# Patient Record
Sex: Female | Born: 1955 | Race: White | Hispanic: No | Marital: Married | Smoking: Never smoker
Health system: Southern US, Community
[De-identification: ages and names within clinical notes are randomized; demographics above are authoritative.]

## PROBLEM LIST (undated history)

## (undated) DIAGNOSIS — M1990 Unspecified osteoarthritis, unspecified site: Secondary | ICD-10-CM

## (undated) DIAGNOSIS — Z973 Presence of spectacles and contact lenses: Secondary | ICD-10-CM

## (undated) DIAGNOSIS — E039 Hypothyroidism, unspecified: Secondary | ICD-10-CM

## (undated) DIAGNOSIS — Z9889 Other specified postprocedural states: Secondary | ICD-10-CM

## (undated) DIAGNOSIS — K5903 Drug induced constipation: Secondary | ICD-10-CM

## (undated) DIAGNOSIS — E079 Disorder of thyroid, unspecified: Secondary | ICD-10-CM

## (undated) DIAGNOSIS — K219 Gastro-esophageal reflux disease without esophagitis: Secondary | ICD-10-CM

## (undated) DIAGNOSIS — T402X5A Adverse effect of other opioids, initial encounter: Secondary | ICD-10-CM

## (undated) DIAGNOSIS — I639 Cerebral infarction, unspecified: Secondary | ICD-10-CM

## (undated) DIAGNOSIS — R112 Nausea with vomiting, unspecified: Secondary | ICD-10-CM

## (undated) DIAGNOSIS — E7800 Pure hypercholesterolemia, unspecified: Secondary | ICD-10-CM

## (undated) HISTORY — PX: BILATERAL SALPINGOOPHORECTOMY: SHX1223

## (undated) HISTORY — PX: APPENDECTOMY: SHX54

## (undated) HISTORY — PX: DILATION AND CURETTAGE OF UTERUS: SHX78

## (undated) HISTORY — PX: HERNIA REPAIR: SHX51

## (undated) HISTORY — PX: TONSILLECTOMY: SUR1361

## (undated) HISTORY — PX: ABDOMINAL HYSTERECTOMY: SHX81

## (undated) HISTORY — PX: COLONOSCOPY: SHX174

---

## 2009-11-23 DIAGNOSIS — I639 Cerebral infarction, unspecified: Secondary | ICD-10-CM

## 2009-11-23 HISTORY — DX: Cerebral infarction, unspecified: I63.9

## 2010-11-17 ENCOUNTER — Emergency Department (HOSPITAL_BASED_OUTPATIENT_CLINIC_OR_DEPARTMENT_OTHER)
Admission: EM | Admit: 2010-11-17 | Discharge: 2010-11-17 | Disposition: A | Discharge status: Other Acute Inpatient Hospital | Payer: Self-pay | Source: Home / Self Care | Admitting: Emergency Medicine

## 2011-02-02 LAB — BASIC METABOLIC PANEL
CO2: 27 mEq/L (ref 19–32)
Creatinine, Ser: 0.6 mg/dL (ref 0.4–1.2)
GFR calc Af Amer: >60 mL/min (ref >60)
Glucose, Bld: 125 mg/dL — ABNORMAL HIGH (ref 70–99)
Potassium: 3.7 mEq/L (ref 3.5–5.1)
Sodium: 139 mEq/L (ref 135–145)

## 2011-02-02 LAB — DIFFERENTIAL
Basophils Absolute: 0 10*3/uL (ref 0.0–0.1)
Basophils Relative: 0 % (ref 0–1)
Eosinophils Absolute: 0 10*3/uL (ref 0.0–0.7)
Eosinophils Relative: 0 % (ref 0–5)

## 2011-02-02 LAB — CBC
HCT: 37.9 % (ref 36.0–46.0)
MCH: 30.1 pg (ref 26.0–34.0)
Platelets: 90 10*3/uL — ABNORMAL LOW (ref 150–400)
RDW: 12.5 % (ref 11.5–15.5)

## 2011-02-02 LAB — URINALYSIS, ROUTINE W REFLEX MICROSCOPIC
Ketones, ur: NEGATIVE mg/dL
Nitrite: NEGATIVE
Specific Gravity, Urine: 1.023 (ref 1.005–1.030)

## 2014-10-08 ENCOUNTER — Emergency Department (HOSPITAL_BASED_OUTPATIENT_CLINIC_OR_DEPARTMENT_OTHER): Payer: BC Managed Care – PPO

## 2014-10-08 ENCOUNTER — Emergency Department (HOSPITAL_BASED_OUTPATIENT_CLINIC_OR_DEPARTMENT_OTHER)
Admission: EM | Admit: 2014-10-08 | Discharge: 2014-10-08 | Disposition: A | Discharge status: Home / Self Care | Payer: BC Managed Care – PPO | Attending: Emergency Medicine | Admitting: Emergency Medicine

## 2014-10-08 ENCOUNTER — Encounter (HOSPITAL_BASED_OUTPATIENT_CLINIC_OR_DEPARTMENT_OTHER): Payer: Self-pay

## 2014-10-08 DIAGNOSIS — Y9289 Other specified places as the place of occurrence of the external cause: Secondary | ICD-10-CM | POA: Insufficient documentation

## 2014-10-08 DIAGNOSIS — Y998 Other external cause status: Secondary | ICD-10-CM | POA: Diagnosis not present

## 2014-10-08 DIAGNOSIS — S4991XA Unspecified injury of right shoulder and upper arm, initial encounter: Secondary | ICD-10-CM | POA: Diagnosis present

## 2014-10-08 DIAGNOSIS — Z880 Allergy status to penicillin: Secondary | ICD-10-CM | POA: Insufficient documentation

## 2014-10-08 DIAGNOSIS — E079 Disorder of thyroid, unspecified: Secondary | ICD-10-CM | POA: Insufficient documentation

## 2014-10-08 DIAGNOSIS — S42214A Unspecified nondisplaced fracture of surgical neck of right humerus, initial encounter for closed fracture: Secondary | ICD-10-CM | POA: Diagnosis not present

## 2014-10-08 DIAGNOSIS — Z79899 Other long term (current) drug therapy: Secondary | ICD-10-CM | POA: Insufficient documentation

## 2014-10-08 DIAGNOSIS — S42211A Unspecified displaced fracture of surgical neck of right humerus, initial encounter for closed fracture: Secondary | ICD-10-CM

## 2014-10-08 DIAGNOSIS — W010XXA Fall on same level from slipping, tripping and stumbling without subsequent striking against object, initial encounter: Secondary | ICD-10-CM | POA: Insufficient documentation

## 2014-10-08 DIAGNOSIS — Y9389 Activity, other specified: Secondary | ICD-10-CM | POA: Insufficient documentation

## 2014-10-08 DIAGNOSIS — E780 Pure hypercholesterolemia: Secondary | ICD-10-CM | POA: Insufficient documentation

## 2014-10-08 DIAGNOSIS — Z8673 Personal history of transient ischemic attack (TIA), and cerebral infarction without residual deficits: Secondary | ICD-10-CM | POA: Diagnosis not present

## 2014-10-08 DIAGNOSIS — T1490XA Injury, unspecified, initial encounter: Secondary | ICD-10-CM

## 2014-10-08 HISTORY — DX: Cerebral infarction, unspecified: I63.9

## 2014-10-08 HISTORY — DX: Disorder of thyroid, unspecified: E07.9

## 2014-10-08 HISTORY — DX: Pure hypercholesterolemia, unspecified: E78.00

## 2014-10-08 MED ORDER — HYDROCODONE-ACETAMINOPHEN 5-325 MG PO TABS: 1.0000 | ORAL_TABLET | Freq: Four times a day (QID) | ORAL | Status: DC | PRN

## 2014-10-08 MED ORDER — HYDROMORPHONE HCL 1 MG/ML IJ SOLN
1.0000 mg | Freq: Once | INTRAMUSCULAR | Status: AC
  Administered 2014-10-08: 1 mg via INTRAMUSCULAR
  Filled 2014-10-08: qty 1

## 2014-10-08 MED ORDER — ONDANSETRON 4 MG PO TBDP
4.0000 mg | ORAL_TABLET | Freq: Once | ORAL | Status: AC
  Administered 2014-10-08: 4 mg via ORAL
  Filled 2014-10-08: qty 1

## 2014-10-08 MED ORDER — HYDROCODONE-ACETAMINOPHEN 5-325 MG PO TABS
ORAL_TABLET | ORAL | Status: AC
  Administered 2014-10-08: 1 via ORAL
  Filled 2014-10-08: qty 1

## 2014-10-08 MED ORDER — HYDROCODONE-ACETAMINOPHEN 5-325 MG PO TABS
1.0000 | ORAL_TABLET | Freq: Once | ORAL | Status: AC
  Administered 2014-10-08: 1 via ORAL

## 2014-10-08 NOTE — ED Notes (Signed)
Trip/fall at home-right shoulder pain

## 2014-10-08 NOTE — ED Notes (Signed)
0.5mg  dilaudid was given at 21:46 and 0.5 was given now as pt states that she continues to have pain

## 2014-10-08 NOTE — ED Provider Notes (Addendum)
CSN: [id number]     Arrival date & time 10/08/14  2038 History  This chart was scribed for Kim CookeyMegan Docherty, MD by Charline BillsEssence Howell, ED Scribe. The patient was seen in room MH06/MH06. Patient's care was started at 10:34 PM.   Chief Complaint  Patient presents with  . Fall   Patient is a 58 y.o. female presenting with fall. The history is provided by the patient. No language interpreter was used.  Fall This is a new problem. The current episode started 1 to 2 hours ago. The problem has been gradually worsening. Pertinent negatives include no chest pain, no abdominal pain, no headaches and no shortness of breath. The symptoms are aggravated by bending and exertion. Nothing relieves the symptoms.    HPI Comments: [name] is a 58 y.o. female, thyroid disease, high cholesterol, stroke, who presents to the Emergency Department complaining of fall that occurred around 8:30 PM. Pt states that she tripped and fell on her R shoulder when she tried to avoid falling on a cat. She reports associated R shoulder pain. Pt denies LOC, hitting her head, abdominal pain, any other pain. She also denies feeling ill prior to fall.  Past Medical History  Diagnosis Date  . Thyroid disease   . High cholesterol   . Stroke    Past Surgical History  Procedure Laterality Date  . Appendectomy    . Tonsillectomy    . Abdominal hysterectomy     No family history on file. History  Substance Use Topics  . Smoking status: Never Smoker   . Smokeless tobacco: Not on file  . Alcohol Use: No   OB History    No data available     Review of Systems  Constitutional: Negative for fever, chills, diaphoresis, activity change, appetite change and fatigue.  HENT: Negative for congestion, facial swelling, rhinorrhea and sore throat.   Eyes: Negative for photophobia and discharge.  Respiratory: Negative for cough, chest tightness and shortness of breath.   Cardiovascular: Negative for chest pain, palpitations and leg  swelling.  Gastrointestinal: Negative for nausea, vomiting, abdominal pain and diarrhea.  Endocrine: Negative for polydipsia and polyuria.  Genitourinary: Negative for dysuria, frequency, difficulty urinating and pelvic pain.  Musculoskeletal: Positive for joint swelling and arthralgias. Negative for back pain, neck pain and neck stiffness.  Skin: Negative for color change and wound.  Allergic/Immunologic: Negative for immunocompromised state.  Neurological: Negative for facial asymmetry, weakness, numbness and headaches.  Hematological: Does not bruise/bleed easily.  Psychiatric/Behavioral: Negative for confusion and agitation.   Allergies  Bactrim and Penicillins  Home Medications   Prior to Admission medications   Medication Sig Start Date End Date Taking? Authorizing Provider  Levothyroxine Sodium (SYNTHROID PO) Take by mouth.   Yes Historical Provider, MD  LORAZEPAM PO Take by mouth.   Yes Historical Provider, MD  Rosuvastatin Calcium (CRESTOR PO) Take by mouth.   Yes Historical Provider, MD  HYDROcodone-acetaminophen (NORCO) 5-325 MG per tablet Take 1-2 tablets by mouth every 6 (six) hours as needed. 10/08/14   Kim CookeyMegan Docherty, MD   Triage Vitals: BP 172/90 mmHg  Pulse 90  Temp(Src) 98.2 F (36.8 C) (Oral)  Resp 18  Ht 5\' 5"  (1.651 m)  Wt 83 lb (37.649 kg)  BMI 13.81 kg/m2  SpO2 97% Physical Exam  Constitutional: She is oriented to person, place, and time. She appears well-developed and well-nourished. No distress.  HENT:  Head: Normocephalic.  Mouth/Throat: Oropharynx is clear and moist.  Eyes: Pupils are equal, round,  and reactive to light.  Neck: Neck supple.  Cardiovascular: Normal rate, regular rhythm and normal heart sounds.   Pulmonary/Chest: Effort normal and breath sounds normal. No respiratory distress. She has no wheezes.  Abdominal: Soft. She exhibits no distension. There is no tenderness. There is no rebound and no guarding.  Musculoskeletal: She exhibits  no edema or tenderness.       Right shoulder: She exhibits bony tenderness and swelling. She exhibits no crepitus.  Neurological: She is alert and oriented to person, place, and time. No cranial nerve deficit or sensory deficit. She exhibits normal muscle tone (except for ROM of R shoulder due to pain). Coordination normal. GCS eye subscore is 4. GCS verbal subscore is 5. GCS motor subscore is 6.  Skin: Skin is warm and dry.  Psychiatric: She has a normal mood and affect.  Nursing note and vitals reviewed.  ED Course  Procedures (including critical care time) DIAGNOSTIC STUDIES: Oxygen Saturation is 97% on RA, normal by my interpretation.    COORDINATION OF CARE: 10:52 PM-Discussed treatment plan which includes XR, Dilaudid, Norco and sling with pt at bedside and pt agreed to plan.   Labs Review Labs Reviewed - No data to display  Imaging Review Dg Shoulder Right  10/08/2014   CLINICAL DATA:  Right shoulder pain after falling today. Initial encounter.  EXAM: RIGHT SHOULDER - 2+ VIEW  COMPARISON:  None.  FINDINGS: Patient was unable to position for the axillary view. AP and Y-views are submitted. These demonstrate a mildly displaced fracture of the humeral neck which is best seen on the AP view. There is no dislocation or narrowing of the subacromial space. Probable subpleural scarring is noted at the right lung apex.  IMPRESSION: Mildly displaced fracture of the right humeral neck, presumably acute.   Electronically Signed   By: Roxy HorsemanBill  Veazey M.D.   On: 10/08/2014 21:23    EKG Interpretation None      MDM   Final diagnoses:  Humeral surgical neck fracture, right, closed, initial encounter    Pt is a 58 y.o. female with Pmhx as above who presents with right shoulder pain after mechanical fall as she tripped on a cat this evening. She did not hit her head. No loss of consciousness. She has otherwise been well. On physical exam she has edema and bony tenderness to the proximal humerus  and x-ray with nondisplaced humeral neck fracture. She is neurovascularly intact distally. She has no other signs of trauma. She has no pressured weakness or signs of acute stroke. Patient placed in a shoulder immobilizer for comfort, she'll be given Norco for pain and outpatient follow-up with Dr. [facility].    I personally performed the services described in this documentation, which was scribed in my presence. The recorded information has been reviewed and is accurate.    Kim CookeyMegan Docherty, MD 10/08/14 [id number]  Kim CookeyMegan Docherty, MD 10/08/14 [phone]

## 2014-10-08 NOTE — Discharge Instructions (Signed)
Humerus Fracture, Treated with Immobilization  The humerus is the large bone in your upper arm. You have a broken (fractured) humerus. These fractures are easily diagnosed with X-rays.  TREATMENT   Simple fractures which will heal without disability are treated with simple immobilization. Immobilization means you will wear a cast, splint, or sling. You have a fracture which will do well with immobilization. The fracture will heal well simply by being held in a good position until it is stable enough to begin range of motion exercises. Do not take part in activities which would further injure your arm.   HOME CARE INSTRUCTIONS    Put ice on the injured area.   Put ice in a plastic bag.   Place a towel between your skin and the bag.   Leave the ice on for 15-20 minutes, 03-04 times a day.   If you have a cast:   Do not scratch the skin under the cast using sharp or pointed objects.   Check the skin around the cast every day. You may put lotion on any red or sore areas.   Keep your cast dry and clean.   If you have a splint:   Wear the splint as directed.   Keep your splint dry and clean.   You may loosen the elastic around the splint if your fingers become numb, tingle, or turn cold or blue.   If you have a sling:   Wear the sling as directed.   Do not put pressure on any part of your cast or splint until it is fully hardened.   Your cast or splint can be protected during bathing with a plastic bag. Do not lower the cast or splint into water.   Only take over-the-counter or prescription medicines for pain, discomfort, or fever as directed by your caregiver.   Do range of motion exercises as instructed by your caregiver.   Follow up as directed by your caregiver. This is very important in order to avoid permanent injury or disability and chronic pain.  SEEK IMMEDIATE MEDICAL CARE IF:    Your skin or nails in the injured arm turn blue or gray.   Your arm feels cold or numb.   You develop severe  pain in the injured arm.   You are having problems with the medicines you were given.  MAKE SURE YOU:    Understand these instructions.   Will watch your condition.   Will get help right away if you are not doing well or get worse.  Document Released: 02/15/2001 Document Revised: 02/01/2012 Document Reviewed: 12/24/2010  Exit[employer] Patient Information 2015 [employer]. This information is not intended to replace advice given to you by your health care provider. Make sure you discuss any questions you have with your health care provider.

## 2014-10-15 ENCOUNTER — Ambulatory Visit: Payer: BC Managed Care – PPO | Admitting: Family Medicine

## 2014-10-15 ENCOUNTER — Ambulatory Visit (HOSPITAL_BASED_OUTPATIENT_CLINIC_OR_DEPARTMENT_OTHER)
Admission: RE | Admit: 2014-10-15 | Discharge: 2014-10-15 | Disposition: A | Discharge status: Home / Self Care | Payer: BC Managed Care – PPO | Source: Ambulatory Visit | Attending: Family Medicine | Admitting: Family Medicine

## 2014-10-15 ENCOUNTER — Encounter: Payer: Self-pay | Admitting: Family Medicine

## 2014-10-15 ENCOUNTER — Other Ambulatory Visit: Payer: Self-pay | Admitting: Family Medicine

## 2014-10-15 ENCOUNTER — Ambulatory Visit (INDEPENDENT_AMBULATORY_CARE_PROVIDER_SITE_OTHER): Payer: BC Managed Care – PPO | Admitting: Family Medicine

## 2014-10-15 VITALS — Ht 65.0 in | Wt 83.0 lb

## 2014-10-15 DIAGNOSIS — S42291P Other displaced fracture of upper end of right humerus, subsequent encounter for fracture with malunion: Secondary | ICD-10-CM | POA: Diagnosis not present

## 2014-10-15 DIAGNOSIS — M25519 Pain in unspecified shoulder: Secondary | ICD-10-CM | POA: Diagnosis present

## 2014-10-15 DIAGNOSIS — M25521 Pain in right elbow: Secondary | ICD-10-CM | POA: Diagnosis present

## 2014-10-15 DIAGNOSIS — S42201A Unspecified fracture of upper end of right humerus, initial encounter for closed fracture: Secondary | ICD-10-CM

## 2014-10-15 DIAGNOSIS — W19XXXD Unspecified fall, subsequent encounter: Secondary | ICD-10-CM | POA: Diagnosis not present

## 2014-10-15 MED ORDER — HYDROCODONE-ACETAMINOPHEN 5-325 MG PO TABS: 1.0000 | ORAL_TABLET | Freq: Four times a day (QID) | ORAL | Status: DC | PRN

## 2014-10-15 MED ORDER — METHOCARBAMOL 500 MG PO TABS: 500.0000 mg | ORAL_TABLET | Freq: Three times a day (TID) | ORAL | Status: DC | PRN

## 2014-10-15 NOTE — Patient Instructions (Addendum)
Keep your appointment tomorrow with the orthopedic physician. Icing 15 minutes at a time 3-4 times a day. Tylenol OR Norco four times a day as needed for pain. Hold your aspirin for now. I'd recommend taking colace 100mg  twice a day and miralax once a day if you're having issues with constipation from the pain medication.

## 2014-10-17 DIAGNOSIS — S42201A Unspecified fracture of upper end of right humerus, initial encounter for closed fracture: Secondary | ICD-10-CM | POA: Insufficient documentation

## 2014-10-17 NOTE — Assessment & Plan Note (Signed)
unfortunately she has had some additional displacement of fracture site since initial radiographs.  Given degree of displacement I advised them we should move ahead with an urgent orthopedic surgery referral to discuss surgical vs nonsurgical options at this point.  She has an appointment with Dr. Dion SaucierLandau tomorrow.

## 2014-10-17 NOTE — Progress Notes (Signed)
PCP: Burnis MedinFULBRIGHT, VIRGINIA E, PA-C  Subjective:   HPI: Patient is a 58 y.o. female here for right arm injury.  Patient reports on 11/16 she accidentally tripped over the cat and landed onto right shoulder. Unable to move - went to ED and diagnosed with proximal humerus fracture, mildly displaced. Been using sling. Taking vicodin and icing. + swelling and a lot of bruising - both moving down her arm. No prior injuries.  Past Medical History  Diagnosis Date  . Thyroid disease   . High cholesterol   . Stroke     Current Outpatient Prescriptions on File Prior to Visit  Medication Sig Dispense Refill  . Levothyroxine Sodium (SYNTHROID PO) Take by mouth.    Marland Kitchen. LORAZEPAM PO Take by mouth.    . Rosuvastatin Calcium (CRESTOR PO) Take by mouth.     No current facility-administered medications on file prior to visit.    Past Surgical History  Procedure Laterality Date  . Appendectomy    . Tonsillectomy    . Abdominal hysterectomy      Allergies  Allergen Reactions  . Bactrim [Sulfamethoxazole-Trimethoprim]   . Penicillins     History   Social History  . Marital Status: Married    Spouse Name: N/A    Number of Children: N/A  . Years of Education: N/A   Occupational History  . Not on file.   Social History Main Topics  . Smoking status: Never Smoker   . Smokeless tobacco: Not on file  . Alcohol Use: No  . Drug Use: No  . Sexual Activity: Not on file   Other Topics Concern  . Not on file   Social History Narrative    No family history on file.  Ht 5\' 5"  (1.651 m)  Wt 83 lb (37.649 kg)  BMI 13.81 kg/m2  Review of Systems: See HPI above.    Objective:  Physical Exam:  Gen: NAD  Right shoulder/arm. Significant bruising, swelling upper arm through elbow. TTP throughout upper arm including proximal humerus. Did not test ROM of shoulder with known fracture. FROM elbow, wrist and digits. Sensation intact axillary nerve distribution and entire arm.     Assessment & Plan:  1. Right proximal humerus fracture - unfortunately she has had some additional displacement of fracture site since initial radiographs.  Given degree of displacement I advised them we should move ahead with an urgent orthopedic surgery referral to discuss surgical vs nonsurgical options at this point.  She has an appointment with Dr. Dion SaucierLandau tomorrow.

## 2014-10-23 ENCOUNTER — Encounter (HOSPITAL_BASED_OUTPATIENT_CLINIC_OR_DEPARTMENT_OTHER): Payer: Self-pay | Admitting: *Deleted

## 2014-10-23 NOTE — Progress Notes (Signed)
Pt has always been small-drinking protein shacks-just had labs and ekg pcp-will call

## 2014-10-26 ENCOUNTER — Ambulatory Visit (HOSPITAL_BASED_OUTPATIENT_CLINIC_OR_DEPARTMENT_OTHER): Payer: BC Managed Care – PPO | Admitting: Certified Registered"

## 2014-10-26 ENCOUNTER — Encounter (HOSPITAL_BASED_OUTPATIENT_CLINIC_OR_DEPARTMENT_OTHER)
Admission: RE | Disposition: A | Discharge status: Home / Self Care | Payer: Self-pay | Source: Ambulatory Visit | Attending: Orthopedic Surgery

## 2014-10-26 ENCOUNTER — Ambulatory Visit (HOSPITAL_BASED_OUTPATIENT_CLINIC_OR_DEPARTMENT_OTHER)
Admission: RE | Admit: 2014-10-26 | Discharge: 2014-10-26 | Disposition: A | Discharge status: Home / Self Care | Payer: BC Managed Care – PPO | Source: Ambulatory Visit | Attending: Orthopedic Surgery | Admitting: Orthopedic Surgery

## 2014-10-26 ENCOUNTER — Encounter (HOSPITAL_BASED_OUTPATIENT_CLINIC_OR_DEPARTMENT_OTHER): Payer: Self-pay | Admitting: Certified Registered"

## 2014-10-26 ENCOUNTER — Ambulatory Visit (HOSPITAL_COMMUNITY): Payer: BC Managed Care – PPO

## 2014-10-26 DIAGNOSIS — Z888 Allergy status to other drugs, medicaments and biological substances status: Secondary | ICD-10-CM | POA: Insufficient documentation

## 2014-10-26 DIAGNOSIS — S42201A Unspecified fracture of upper end of right humerus, initial encounter for closed fracture: Secondary | ICD-10-CM | POA: Diagnosis present

## 2014-10-26 DIAGNOSIS — E039 Hypothyroidism, unspecified: Secondary | ICD-10-CM | POA: Insufficient documentation

## 2014-10-26 DIAGNOSIS — Z8673 Personal history of transient ischemic attack (TIA), and cerebral infarction without residual deficits: Secondary | ICD-10-CM | POA: Insufficient documentation

## 2014-10-26 DIAGNOSIS — K5909 Other constipation: Secondary | ICD-10-CM | POA: Diagnosis not present

## 2014-10-26 DIAGNOSIS — Z90710 Acquired absence of both cervix and uterus: Secondary | ICD-10-CM | POA: Insufficient documentation

## 2014-10-26 DIAGNOSIS — T849XXA Unspecified complication of internal orthopedic prosthetic device, implant and graft, initial encounter: Secondary | ICD-10-CM

## 2014-10-26 DIAGNOSIS — Z880 Allergy status to penicillin: Secondary | ICD-10-CM | POA: Insufficient documentation

## 2014-10-26 DIAGNOSIS — Z9049 Acquired absence of other specified parts of digestive tract: Secondary | ICD-10-CM | POA: Insufficient documentation

## 2014-10-26 DIAGNOSIS — M1990 Unspecified osteoarthritis, unspecified site: Secondary | ICD-10-CM | POA: Diagnosis not present

## 2014-10-26 DIAGNOSIS — K219 Gastro-esophageal reflux disease without esophagitis: Secondary | ICD-10-CM | POA: Insufficient documentation

## 2014-10-26 DIAGNOSIS — E43 Unspecified severe protein-calorie malnutrition: Secondary | ICD-10-CM | POA: Diagnosis not present

## 2014-10-26 DIAGNOSIS — M810 Age-related osteoporosis without current pathological fracture: Secondary | ICD-10-CM | POA: Diagnosis not present

## 2014-10-26 DIAGNOSIS — Z7982 Long term (current) use of aspirin: Secondary | ICD-10-CM | POA: Diagnosis not present

## 2014-10-26 DIAGNOSIS — E780 Pure hypercholesterolemia: Secondary | ICD-10-CM | POA: Insufficient documentation

## 2014-10-26 HISTORY — DX: Gastro-esophageal reflux disease without esophagitis: K21.9

## 2014-10-26 HISTORY — PX: ORIF HUMERUS FRACTURE: SHX2126

## 2014-10-26 HISTORY — DX: Adverse effect of other opioids, initial encounter: T40.2X5A

## 2014-10-26 HISTORY — DX: Drug induced constipation: K59.03

## 2014-10-26 HISTORY — DX: Presence of spectacles and contact lenses: Z97.3

## 2014-10-26 HISTORY — DX: Hypothyroidism, unspecified: E03.9

## 2014-10-26 HISTORY — DX: Unspecified osteoarthritis, unspecified site: M19.90

## 2014-10-26 HISTORY — DX: Nausea with vomiting, unspecified: R11.2

## 2014-10-26 HISTORY — DX: Other specified postprocedural states: Z98.890

## 2014-10-26 LAB — POCT HEMOGLOBIN-HEMACUE: HEMOGLOBIN: 13.2 g/dL (ref 12.0–15.0)

## 2014-10-26 SURGERY — OPEN REDUCTION INTERNAL FIXATION (ORIF) PROXIMAL HUMERUS FRACTURE
Anesthesia: General | Site: Shoulder | Laterality: Right

## 2014-10-26 MED ORDER — VANCOMYCIN HCL IN DEXTROSE 1-5 GM/200ML-% IV SOLN
INTRAVENOUS | Status: AC
  Filled 2014-10-26: qty 200

## 2014-10-26 MED ORDER — DEXAMETHASONE SODIUM PHOSPHATE 10 MG/ML IJ SOLN
INTRAMUSCULAR | Status: DC | PRN
  Administered 2014-10-26: 8 mg via INTRAVENOUS

## 2014-10-26 MED ORDER — MIDAZOLAM HCL 2 MG/2ML IJ SOLN
INTRAMUSCULAR | Status: AC
  Filled 2014-10-26: qty 2

## 2014-10-26 MED ORDER — FENTANYL CITRATE 0.05 MG/ML IJ SOLN
50.0000 ug | INTRAMUSCULAR | Status: DC | PRN
  Administered 2014-10-26: 50 ug via INTRAVENOUS

## 2014-10-26 MED ORDER — ONDANSETRON HCL 4 MG/2ML IJ SOLN: 4.0000 mg | Freq: Once | INTRAMUSCULAR | Status: DC | PRN

## 2014-10-26 MED ORDER — CEFAZOLIN SODIUM-DEXTROSE 2-3 GM-% IV SOLR
2.0000 g | INTRAVENOUS | Status: AC
  Administered 2014-10-26: 2 g via INTRAVENOUS

## 2014-10-26 MED ORDER — LIDOCAINE HCL (CARDIAC) 20 MG/ML IV SOLN
INTRAVENOUS | Status: DC | PRN
  Administered 2014-10-26: 30 mg via INTRAVENOUS

## 2014-10-26 MED ORDER — FENTANYL CITRATE 0.05 MG/ML IJ SOLN
INTRAMUSCULAR | Status: DC | PRN
  Administered 2014-10-26: 100 ug via INTRAVENOUS

## 2014-10-26 MED ORDER — PROPOFOL 10 MG/ML IV BOLUS
INTRAVENOUS | Status: DC | PRN
  Administered 2014-10-26: 150 mg via INTRAVENOUS

## 2014-10-26 MED ORDER — OXYCODONE HCL 5 MG/5ML PO SOLN: 5.0000 mg | Freq: Once | ORAL | Status: DC | PRN

## 2014-10-26 MED ORDER — CYCLOBENZAPRINE HCL 5 MG PO TABS: 5.0000 mg | ORAL_TABLET | Freq: Three times a day (TID) | ORAL | Status: AC | PRN

## 2014-10-26 MED ORDER — SENNA-DOCUSATE SODIUM 8.6-50 MG PO TABS: 2.0000 | ORAL_TABLET | Freq: Every day | ORAL | Status: AC

## 2014-10-26 MED ORDER — LACTATED RINGERS IV SOLN
INTRAVENOUS | Status: DC
  Administered 2014-10-26 (×2): via INTRAVENOUS

## 2014-10-26 MED ORDER — OXYCODONE-ACETAMINOPHEN 5-325 MG PO TABS: 1.0000 | ORAL_TABLET | Freq: Four times a day (QID) | ORAL | Status: AC | PRN

## 2014-10-26 MED ORDER — HYDROMORPHONE HCL 1 MG/ML IJ SOLN: 0.2500 mg | INTRAMUSCULAR | Status: DC | PRN

## 2014-10-26 MED ORDER — MIDAZOLAM HCL 5 MG/5ML IJ SOLN
INTRAMUSCULAR | Status: DC | PRN
  Administered 2014-10-26: 2 mg via INTRAVENOUS

## 2014-10-26 MED ORDER — FENTANYL CITRATE 0.05 MG/ML IJ SOLN
INTRAMUSCULAR | Status: AC
  Filled 2014-10-26: qty 2

## 2014-10-26 MED ORDER — OXYCODONE HCL 5 MG PO TABS: 5.0000 mg | ORAL_TABLET | Freq: Once | ORAL | Status: DC | PRN

## 2014-10-26 MED ORDER — FENTANYL CITRATE 0.05 MG/ML IJ SOLN
INTRAMUSCULAR | Status: AC
  Filled 2014-10-26: qty 8

## 2014-10-26 MED ORDER — MIDAZOLAM HCL 2 MG/2ML IJ SOLN
1.0000 mg | INTRAMUSCULAR | Status: DC | PRN
  Administered 2014-10-26: 1 mg via INTRAVENOUS

## 2014-10-26 MED ORDER — EPHEDRINE SULFATE 50 MG/ML IJ SOLN
INTRAMUSCULAR | Status: DC | PRN
  Administered 2014-10-26 (×2): 10 mg via INTRAVENOUS

## 2014-10-26 MED ORDER — ONDANSETRON HCL 4 MG/2ML IJ SOLN
INTRAMUSCULAR | Status: DC | PRN
  Administered 2014-10-26: 4 mg via INTRAVENOUS

## 2014-10-26 MED ORDER — ONDANSETRON HCL 4 MG PO TABS: 4.0000 mg | ORAL_TABLET | Freq: Three times a day (TID) | ORAL | Status: AC | PRN

## 2014-10-26 SURGICAL SUPPLY — 91 items
BANDAGE ELASTIC 4 VELCRO ST LF (GAUZE/BANDAGES/DRESSINGS) IMPLANT
BANDAGE ELASTIC 6 VELCRO ST LF (GAUZE/BANDAGES/DRESSINGS) IMPLANT
BENZOIN TINCTURE PRP APPL 2/3 (GAUZE/BANDAGES/DRESSINGS) IMPLANT
BIT DRILL 2.8X4 QC CORT (BIT) ×3 IMPLANT
BIT DRILL 4 LONG FAST STEP (BIT) ×3 IMPLANT
BIT DRILL 4 SHORT FAST STEP (BIT) ×3 IMPLANT
BLADE SURG 10 STRL SS (BLADE) IMPLANT
BLADE SURG 15 STRL LF DISP TIS (BLADE) ×2 IMPLANT
BLADE SURG 15 STRL SS (BLADE) ×4
BLADE VORTEX 6.0 (BLADE) IMPLANT
BNDG GAUZE ELAST 4 BULKY (GAUZE/BANDAGES/DRESSINGS) IMPLANT
CANISTER SUCT 3000ML (MISCELLANEOUS) IMPLANT
CLEANER CAUTERY TIP 5X5 PAD (MISCELLANEOUS) IMPLANT
CLOSURE STERI-STRIP 1/2X4 (GAUZE/BANDAGES/DRESSINGS) ×1
CLSR STERI-STRIP ANTIMIC 1/2X4 (GAUZE/BANDAGES/DRESSINGS) ×2 IMPLANT
DECANTER SPIKE VIAL GLASS SM (MISCELLANEOUS) IMPLANT
DRAPE C-ARM 42X72 X-RAY (DRAPES) ×3 IMPLANT
DRAPE INCISE IOBAN 66X45 STRL (DRAPES) IMPLANT
DRAPE SURG 17X23 STRL (DRAPES) IMPLANT
DRAPE U 20/CS (DRAPES) ×3 IMPLANT
DRAPE U-SHAPE 47X51 STRL (DRAPES) ×3 IMPLANT
DRAPE U-SHAPE 76X120 STRL (DRAPES) ×6 IMPLANT
DRSG MEPILEX BORDER 4X8 (GAUZE/BANDAGES/DRESSINGS) ×3 IMPLANT
DURAPREP 26ML APPLICATOR (WOUND CARE) ×3 IMPLANT
ELECT REM PT RETURN 9FT ADLT (ELECTROSURGICAL) ×3
ELECTRODE REM PT RTRN 9FT ADLT (ELECTROSURGICAL) ×1 IMPLANT
GAUZE SPONGE 4X4 12PLY STRL (GAUZE/BANDAGES/DRESSINGS) ×3 IMPLANT
GAUZE SPONGE 4X4 16PLY XRAY LF (GAUZE/BANDAGES/DRESSINGS) IMPLANT
GAUZE XEROFORM 1X8 LF (GAUZE/BANDAGES/DRESSINGS) IMPLANT
GLOVE BIO SURGEON STRL SZ 6.5 (GLOVE) ×2 IMPLANT
GLOVE BIO SURGEON STRL SZ8 (GLOVE) ×6 IMPLANT
GLOVE BIO SURGEONS STRL SZ 6.5 (GLOVE) ×1
GLOVE BIOGEL PI IND STRL 7.0 (GLOVE) ×2 IMPLANT
GLOVE BIOGEL PI IND STRL 8 (GLOVE) ×3 IMPLANT
GLOVE BIOGEL PI INDICATOR 7.0 (GLOVE) ×4
GLOVE BIOGEL PI INDICATOR 8 (GLOVE) ×6
GLOVE ORTHO TXT STRL SZ7.5 (GLOVE) ×3 IMPLANT
GOWN STRL REUS W/ TWL LRG LVL3 (GOWN DISPOSABLE) ×1 IMPLANT
GOWN STRL REUS W/ TWL XL LVL3 (GOWN DISPOSABLE) ×2 IMPLANT
GOWN STRL REUS W/TWL LRG LVL3 (GOWN DISPOSABLE) ×2
GOWN STRL REUS W/TWL XL LVL3 (GOWN DISPOSABLE) ×4
NS IRRIG 1000ML POUR BTL (IV SOLUTION) ×3 IMPLANT
PACK ARTHROSCOPY DSU (CUSTOM PROCEDURE TRAY) ×3 IMPLANT
PACK BASIN DAY SURGERY FS (CUSTOM PROCEDURE TRAY) ×3 IMPLANT
PAD CAST 4YDX4 CTTN HI CHSV (CAST SUPPLIES) IMPLANT
PAD CLEANER CAUTERY TIP 5X5 (MISCELLANEOUS)
PADDING CAST COTTON 4X4 STRL (CAST SUPPLIES)
PEG STND 4.0X30MM (Orthopedic Implant) ×3 IMPLANT
PEG STND 4.0X32.5MM (Orthopedic Implant) ×3 IMPLANT
PEG STND 4.0X35MM (Orthopedic Implant) ×3 IMPLANT
PEG STND 4.0X37.5MM (Orthopedic Implant) ×3 IMPLANT
PEG STND 4.0X40MM (Orthopedic Implant) ×3 IMPLANT
PEG STND 4.0X45.0MM (Orthopedic Implant) ×3 IMPLANT
PEGSTD 4.0X30MM (Orthopedic Implant) ×1 IMPLANT
PEGSTD 4.0X32.5MM (Orthopedic Implant) ×1 IMPLANT
PEGSTD 4.0X35MM (Orthopedic Implant) ×1 IMPLANT
PEGSTD 4.0X37.5MM (Orthopedic Implant) ×1 IMPLANT
PEGSTD 4.0X40MM (Orthopedic Implant) ×1 IMPLANT
PEGSTD 4.0X45.0MM (Orthopedic Implant) ×1 IMPLANT
PENCIL BUTTON HOLSTER BLD 10FT (ELECTRODE) ×3 IMPLANT
PIN GUIDE SHOULDER 2.0MM (PIN) ×6 IMPLANT
PLATE SHOULDER S3 3HOLE RT (Plate) ×3 IMPLANT
SCREW LOCK 90D ANGLED 3.8X24 (Screw) ×3 IMPLANT
SCREW MULTIDIR 3.8X24 HUMRL (Screw) ×3 IMPLANT
SLEEVE SCD COMPRESS KNEE MED (MISCELLANEOUS) ×3 IMPLANT
SLING ARM IMMOBILIZER LRG (SOFTGOODS) IMPLANT
SLING ARM IMMOBILIZER MED (SOFTGOODS) IMPLANT
SLING ARM LRG ADULT FOAM STRAP (SOFTGOODS) IMPLANT
SLING ARM MED ADULT FOAM STRAP (SOFTGOODS) ×3 IMPLANT
SLING ARM XL FOAM STRAP (SOFTGOODS) IMPLANT
SPONGE LAP 18X18 X RAY DECT (DISPOSABLE) ×3 IMPLANT
SPONGE LAP 4X18 X RAY DECT (DISPOSABLE) ×3 IMPLANT
SUCTION FRAZIER TIP 10 FR DISP (SUCTIONS) ×3 IMPLANT
SUPPORT WRAP ARM LG (MISCELLANEOUS) IMPLANT
SUT FIBERWIRE #2 38 T-5 BLUE (SUTURE) ×6
SUT MNCRL AB 4-0 PS2 18 (SUTURE) IMPLANT
SUT VIC AB 0 CT1 18XCR BRD 8 (SUTURE) IMPLANT
SUT VIC AB 0 CT1 27 (SUTURE)
SUT VIC AB 0 CT1 27XBRD ANBCTR (SUTURE) IMPLANT
SUT VIC AB 0 CT1 8-18 (SUTURE)
SUT VIC AB 0 SH 27 (SUTURE) ×3 IMPLANT
SUT VIC AB 2-0 SH 27 (SUTURE) ×2
SUT VIC AB 2-0 SH 27XBRD (SUTURE) ×1 IMPLANT
SUT VICRYL 3-0 CR8 SH (SUTURE) ×3 IMPLANT
SUTURE FIBERWR #2 38 T-5 BLUE (SUTURE) ×2 IMPLANT
SYR BULB 3OZ (MISCELLANEOUS) ×3 IMPLANT
SYR BULB IRRIGATION 50ML (SYRINGE) IMPLANT
TAPE STRIPS DRAPE STRL (GAUZE/BANDAGES/DRESSINGS) IMPLANT
TOWEL OR 17X24 6PK STRL BLUE (TOWEL DISPOSABLE) ×3 IMPLANT
TOWEL OR NON WOVEN STRL DISP B (DISPOSABLE) ×3 IMPLANT
YANKAUER SUCT BULB TIP NO VENT (SUCTIONS) ×3 IMPLANT

## 2014-10-26 NOTE — Progress Notes (Signed)
Assisted Dr. Joslin with right, ultrasound guided, interscalene  block. Side rails up, monitors on throughout procedure. See vital signs in flow sheet. Tolerated Procedure well. 

## 2014-10-26 NOTE — Anesthesia Procedure Notes (Addendum)
Anesthesia Regional Block:  Interscalene brachial plexus block  Pre-Anesthetic Checklist: ,, timeout performed, Correct Patient, Correct Site, Correct Laterality, Correct Procedure, Correct Position, site marked, Risks and benefits discussed,  Surgical consent,  Pre-op evaluation,  At surgeon's request and post-op pain management  Laterality: Right  Prep: chloraprep       Needles:  Injection technique: Single-shot  Needle Type: Echogenic Stimulator Needle     Needle Length: 5cm 5 cm Needle Gauge: 22 and 22 G    Additional Needles:  Procedures: ultrasound guided (picture in chart) Interscalene brachial plexus block Narrative:  Start time: 10/26/2014 1:15 PM End time: 10/26/2014 1:20 PM Injection made incrementally with aspirations every 5 mL.  Performed by: Personally   Additional Notes: 25 cc 0.5% marcaine with 1:200 epi injected easily   Procedure Name: LMA Insertion Date/Time: 10/26/2014 2:15 PM Performed by: Daris Aristizabal Pre-anesthesia Checklist: Patient identified, Emergency Drugs available, Suction available and Patient being monitored Patient Re-evaluated:Patient Re-evaluated prior to inductionOxygen Delivery Method: Circle System Utilized Preoxygenation: Pre-oxygenation with 100% oxygen Intubation Type: IV induction Ventilation: Mask ventilation without difficulty LMA: LMA inserted LMA Size: 4.0 Number of attempts: 1 Airway Equipment and Method: bite block Placement Confirmation: positive ETCO2 Tube secured with: Tape Dental Injury: Teeth and Oropharynx as per pre-operative assessment

## 2014-10-26 NOTE — Op Note (Signed)
10/26/2014  4:02 PM  PATIENT:  Kim Huff    PRE-OPERATIVE DIAGNOSIS:  Right proximal humerus fracture  POST-OPERATIVE DIAGNOSIS:  Same  PROCEDURE:  RIGHT SHOULDER OPEN REDUCTION INTERNAL FIXATION (ORIF) PROXIMAL HUMERUS  SURGEON:  Eulas PostLANDAU,Jakeel Starliper P, MD  PHYSICIAN ASSISTANT: Janace LittenBrandon Parry, OPA-C, present and scrubbed throughout the case, critical for completion in a timely fashion, and for retraction, instrumentation, and closure.  ANESTHESIA:   General  PREOPERATIVE INDICATIONS:  Kim Huff is a  58 y.o. female who broke her right proximal humerus, and had progressive displacement. She elected for surgical management. 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  The risks benefits and alternatives were discussed with the patient including but not limited to the risks of nonoperative treatment, versus surgical intervention including infection, bleeding, nerve injury, malunion, nonunion, the need for revision surgery, hardware prominence, hardware failure, the need for hardware removal, blood clots, cardiopulmonary complications, conversion to arthroplasty, morbidity, mortality, among others, and they were willing to proceed.  Predicted outcome is good, although there will be at least a six to nine month expected recovery.    OPERATIVE IMPLANTS: Biomet S3 locking plate  OPERATIVE FINDINGS: Displaced proximal humerus fracture  OPERATIVE PROCEDURE: The patient was brought to the operating room and placed in the supine position. General anesthesia was administered. IV antibiotics were given. She was placed in the supine position. All bony prominences were padded. The upper extremity was prepped and draped in usual sterile fashion. Deltopectoral incision was performed.  I exposed the fracture site, and placed deep retractors. I did not tenotomize the biceps tendon. This was left in place.  I elevated a small portion of the deltoid off of the shaft, in order to gain access for the plate. I placed supraspinatus and subscapularis stitches, and then reduced the head onto the shaft. This was maintained in satisfactory position.  I applied the plate and secured it into the sliding hole first. I confirmed position of the reduction and the plate with C-arm, and I placed a total of 2 guidewires into the appropriate position in the head. I performed a step multiple times, attempting to optimize the reduction. I corrected her varus malalignment, and restored the head directly over the shaft, although I had to mobilize the shaft completely because it was already somewhat healed in a malunited position. The head itself was somewhat impaled by the shaft, and I didn't quite get the height totally anatomic, but settled for appropriate bony apposition and satisfactory alignment.  I was satisfied that the plate was distal appropriately, and then secured the plate proximally with smooth pegs, taking care to prevent penetration into the arch articular surface, using C-arm, as well as manual feel using a hand drill.  I then secured the plate distally using another cortical screw. Turning some of the reduction maneuvers of the superior fiber wire pulled through the supraspinatus, which was only a partial-thickness throw. I did repair this with a 0 Vicryl. I then passed the FiberWire sutures from the subscapularis through the plate and secured the tuberosities. Once complete fixation and reduction of been achieved, took final C-arm pictures, and irrigated the wounds copiously, and repaired the deltopectoral interval with Vicryl followed by Vicryl for the subcutaneous tissue with Monocryl and Steri-Strips for the skin. She was placed in a sling. She had a preoperative regional block as well. She tolerated the procedure well with no complications.

## 2014-10-26 NOTE — Anesthesia Postprocedure Evaluation (Signed)
Anesthesia Post Note  Patient: Kim Huff  Procedure(s) Performed: Procedure(s) (LRB): RIGHT SHOULDER OPEN REDUCTION INTERNAL FIXATION (ORIF) PROXIMAL HUMERUS (Right)  Anesthesia type: general  Patient location: PACU  Post pain: Pain level controlled  Post assessment: Patient's Cardiovascular Status Stable  Last Vitals:  Filed Vitals:   10/26/14 1730  BP: 134/51  Pulse: 77  Temp: 36.5 C  Resp: 16    Post vital signs: Reviewed and stable  Level of consciousness: sedated  Complications: No apparent anesthesia complications

## 2014-10-26 NOTE — Discharge Instructions (Signed)
Diet: As you were doing prior to hospitalization  ° °Shower:  May shower but keep the wounds dry, use an occlusive plastic wrap, NO SOAKING IN TUB.  If the bandage gets wet, change with a clean dry gauze. ° °Dressing:  You may change your dressing 3-5 days after surgery.  Then change the dressing daily with sterile gauze dressing.   ° °There are sticky tapes (steri-strips) on your wounds and all the stitches are absorbable.  Leave the steri-strips in place when changing your dressings, they will peel off with time, usually 2-3 weeks. ° °Activity:  Increase activity slowly as tolerated, but follow the weight bearing instructions below.  No lifting or driving for 6 weeks. ° °Weight Bearing:   Sling at all times..   ° °To prevent constipation: you may use a stool softener such as - ° °Colace (over the counter) 100 mg by mouth twice a day  °Drink plenty of fluids (prune juice may be helpful) and high fiber foods °Miralax (over the counter) for constipation as needed.   ° °Itching:  If you experience itching with your medications, try taking only a single pain pill, or even half a pain pill at a time.  You may take up to 10 pain pills per day, and you can also use benadryl over the counter for itching or also to help with sleep.  ° °Precautions:  If you experience chest pain or shortness of breath - call 911 immediately for transfer to the hospital emergency department!! ° °If you develop a fever greater that 101 F, purulent drainage from wound, increased redness or drainage from wound, or calf pain -- Call the office at [phone]                                                °Follow- Up Appointment:  Please call for an appointment to be seen in 2 weeks [place] - [phone] ° ° °Post Anesthesia Home Care Instructions ° °Activity: °Get plenty of rest for the remainder of the day. A responsible adult should stay with you for 24 hours following the procedure.  °For the next 24 hours, DO NOT: °-Drive a  car °-Operate machinery °-Drink alcoholic beverages °-Take any medication unless instructed by your physician °-Make any legal decisions or sign important papers. ° °Meals: °Start with liquid foods such as gelatin or soup. Progress to regular foods as tolerated. Avoid greasy, spicy, heavy foods. If nausea and/or vomiting occur, drink only clear liquids until the nausea and/or vomiting subsides. Call your physician if vomiting continues. ° °Special Instructions/Symptoms: °Your throat may feel dry or sore from the anesthesia or the breathing tube placed in your throat during surgery. If this causes discomfort, gargle with warm salt water. The discomfort should disappear within 24 hours. ° ° °Regional Anesthesia Blocks ° °1. Numbness or the inability to move the "blocked" extremity may last from 3-48 hours after placement. The length of time depends on the medication injected and your individual response to the medication. If the numbness is not going away after 48 hours, call your surgeon. ° °2. The extremity that is blocked will need to be protected until the numbness is gone and the  Strength has returned. Because you cannot feel it, you will need to take extra care to avoid injury. Because it may be weak, you may have difficulty moving   it or using it. You may not know what position it is in without looking at it while the block is in effect. ° °3. For blocks in the legs and feet, returning to weight bearing and walking needs to be done carefully. You will need to wait until the numbness is entirely gone and the strength has returned. You should be able to move your leg and foot normally before you try and bear weight or walk. You will need someone to be with you when you first try to ensure you do not fall and possibly risk injury. ° °4. Bruising and tenderness at the needle site are common side effects and will resolve in a few days. ° °5. Persistent numbness or new problems with movement should be communicated to  the surgeon or the [facility] ([phone])/ [facility] ([phone]). ° ° ° °

## 2014-10-26 NOTE — H&P (Signed)
PREOPERATIVE H&P  Chief Complaint: Right proximal humerus fracture  HPI: Kim Huff is a 58 y.o. female who presents for preoperative history and physical with a diagnosis of Right displaced proximal humerus fracture, closed Symptoms are rated as moderate to severe, and have been worsening.  This is significantly impairing activities of daily living.  She has elected for surgical management. She has had a previous stroke, and this affected her right side, however she did regain function and was able to use that arm for normal activity, and wished to optimize her long-term function with that extremity.  Past Medical History  Diagnosis Date  . Thyroid disease   . High cholesterol   . Constipation due to opioid therapy   . Stroke 2011    slight weakness rt side  . GERD (gastroesophageal reflux disease)   . Arthritis   . PONV (postoperative nausea and vomiting)   . Wears glasses   . Hypothyroidism    Past Surgical History  Procedure Laterality Date  . Appendectomy    . Tonsillectomy    . Abdominal hysterectomy    . Dilation and curettage of uterus    . Bilateral salpingoophorectomy    . Colonoscopy    . Hernia repair      rt ing   History   Social History  . Marital Status: Married    Spouse Name: N/A    Number of Children: N/A  . Years of Education: N/A   Social History Main Topics  . Smoking status: Never Smoker   . Smokeless tobacco: None  . Alcohol Use: No  . Drug Use: No  . Sexual Activity: None   Other Topics Concern  . None   Social History Narrative   History reviewed. No pertinent family history. Allergies  Allergen Reactions  . Bactrim [Sulfamethoxazole-Trimethoprim] Rash  . Penicillins Rash   Prior to Admission medications   Medication Sig Start Date End Date Taking? Authorizing Provider  aspirin 81 MG tablet Take 81 mg by mouth daily.   Yes Historical Provider, MD  Calcium Carb-Cholecalciferol (CALTRATE 600+D) 600-800 MG-UNIT TABS Take by mouth.    Yes Historical Provider, MD  cyclobenzaprine (FLEXERIL) 5 MG tablet Take 5 mg by mouth 3 (three) times daily as needed for muscle spasms.   Yes Historical Provider, MD  docusate sodium (COLACE) 100 MG capsule Take 100 mg by mouth 2 (two) times daily.   Yes Historical Provider, MD  EXELDERM 1 % cream  09/10/14  Yes Historical Provider, MD  famotidine (PEPCID) 20 MG tablet Take 20 mg by mouth 2 (two) times daily.   Yes Historical Provider, MD  HYDROcodone-acetaminophen (NORCO) 5-325 MG per tablet Take 1 tablet by mouth every 6 (six) hours as needed. 10/15/14  Yes Lenda KelpShane R Hudnall, MD  Levothyroxine Sodium (SYNTHROID PO) Take 50 mcg by mouth every evening.    Yes Historical Provider, MD  LORAZEPAM PO Take 1 mg by mouth 4 (four) times daily.    Yes Historical Provider, MD  methocarbamol (ROBAXIN) 500 MG tablet Take 1 tablet (500 mg total) by mouth every 8 (eight) hours as needed for muscle spasms. 10/15/14  Yes Lenda KelpShane R Hudnall, MD  Nutritional Supplements (COMPLETE PROTEIN/VITAMIN SHAKE PO) Take by mouth 2 (two) times daily.   Yes Historical Provider, MD  Rosuvastatin Calcium (CRESTOR PO) Take by mouth.   Yes Historical Provider, MD  Wheat Dextrin (BENEFIBER DRINK MIX PO) Take by mouth.   Yes Historical Provider, MD     Positive ROS: All  other systems have been reviewed and were otherwise negative with the exception of those mentioned in the HPI and as above.  Physical Exam: General: Alert, no acute distress Cardiovascular: No pedal edema Respiratory: No cyanosis, no use of accessory musculature GI: No organomegaly, abdomen is soft and non-tender Skin: No lesions in the area of chief complaint Neurologic: Sensation intact distally Psychiatric: Patient is competent for consent with normal mood and affect Lymphatic: No axillary or cervical lymphadenopathy  MUSCULOSKELETAL: Right upper extremity has sensation intact throughout, all fingers flex extend and abduct, she has severe pain to palpation  over the proximal humerus.  Assessment: Right displaced proximal humerus fracture, history of stroke, with osteoporosis and malnutrition, protein calorie deficient.  Plan: Plan for Procedure(s): RIGHT SHOULDER OPEN REDUCTION INTERNAL FIXATION (ORIF) PROXIMAL HUMERUS  The risks benefits and alternatives were discussed with the patient including but not limited to the risks of nonoperative treatment, versus surgical intervention including infection, bleeding, nerve injury, malunion, nonunion, the need for revision surgery, hardware prominence, hardware failure, the need for hardware removal, blood clots, cardiopulmonary complications, morbidity, mortality, among others, and they were willing to proceed.     Eulas PostLANDAU,Ramyah Pankowski P, MD Cell [phone]   10/26/2014 1:36 PM

## 2014-10-26 NOTE — Anesthesia Preprocedure Evaluation (Signed)
Anesthesia Evaluation  Patient identified by MRN, date of birth, ID band Patient awake    Reviewed: Allergy & Precautions, H&P , NPO status , Patient's Chart, lab work & pertinent test results  Airway Mallampati: II   Neck ROM: Full    Dental  (+) Teeth Intact, Dental Advisory Given   Pulmonary  breath sounds clear to auscultation        Cardiovascular Rhythm:Regular Rate:Normal     Neuro/Psych    GI/Hepatic   Endo/Other    Renal/GU      Musculoskeletal   Abdominal   Peds  Hematology   Anesthesia Other Findings   Reproductive/Obstetrics                             Anesthesia Physical Anesthesia Plan  ASA: II  Anesthesia Plan: General   Post-op Pain Management:    Induction: Intravenous  Airway Management Planned: LMA  Additional Equipment:   Intra-op Plan:   Post-operative Plan:   Informed Consent: I have reviewed the patients History and Physical, chart, labs and discussed the procedure including the risks, benefits and alternatives for the proposed anesthesia with the patient or authorized representative who has indicated his/her understanding and acceptance.   Dental advisory given  Plan Discussed with: CRNA  Anesthesia Plan Comments: (R. Humerus fracture Anxiety H/O CVA W/U (-) mild R. Sided weakness  Plan GA with interscalene block and LMA  )        Anesthesia Quick Evaluation

## 2014-10-26 NOTE — Transfer of Care (Signed)
Immediate Anesthesia Transfer of Care Note  Patient: Kim Huff  Procedure(s) Performed: Procedure(s): RIGHT SHOULDER OPEN REDUCTION INTERNAL FIXATION (ORIF) PROXIMAL HUMERUS (Right)  Patient Location: PACU  Anesthesia Type:GA combined with regional for post-op pain  Level of Consciousness: awake, alert , oriented and patient cooperative  Airway & Oxygen Therapy: Patient Spontanous Breathing and Patient connected to face mask oxygen  Post-op Assessment: Report given to PACU RN and Post -op Vital signs reviewed and stable  Post vital signs: Reviewed and stable  Complications: No apparent anesthesia complications

## 2014-10-29 ENCOUNTER — Encounter (HOSPITAL_BASED_OUTPATIENT_CLINIC_OR_DEPARTMENT_OTHER): Payer: Self-pay | Admitting: Orthopedic Surgery

## 2016-01-23 IMAGING — CR DG SHOULDER 2+V*R*
2 series · 2 of 2 positions shown · non-contrast
Comparison: None.

CLINICAL DATA: Right shoulder pain after falling today. Initial
encounter.

EXAM:
RIGHT SHOULDER - 2+ VIEW

[w shoulder ap external righ]
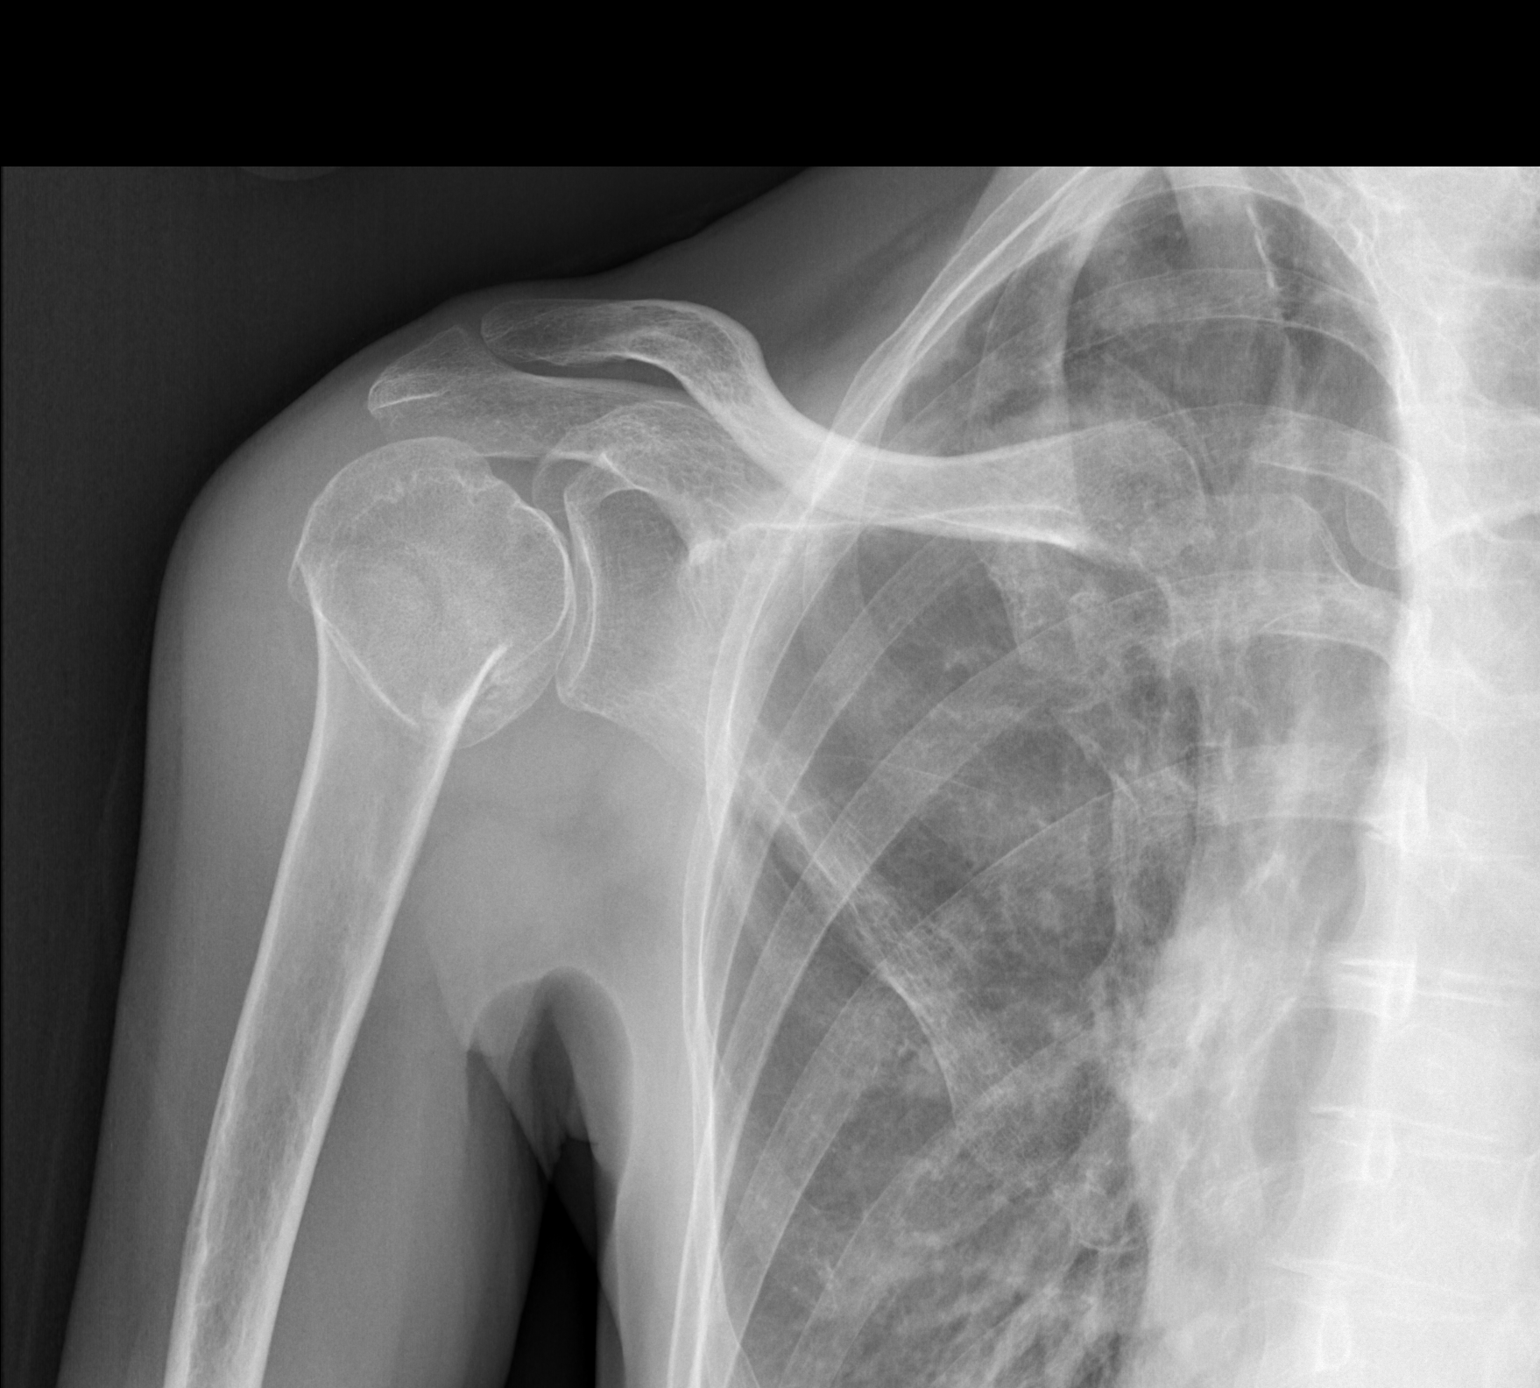

[w shoulder y view right]
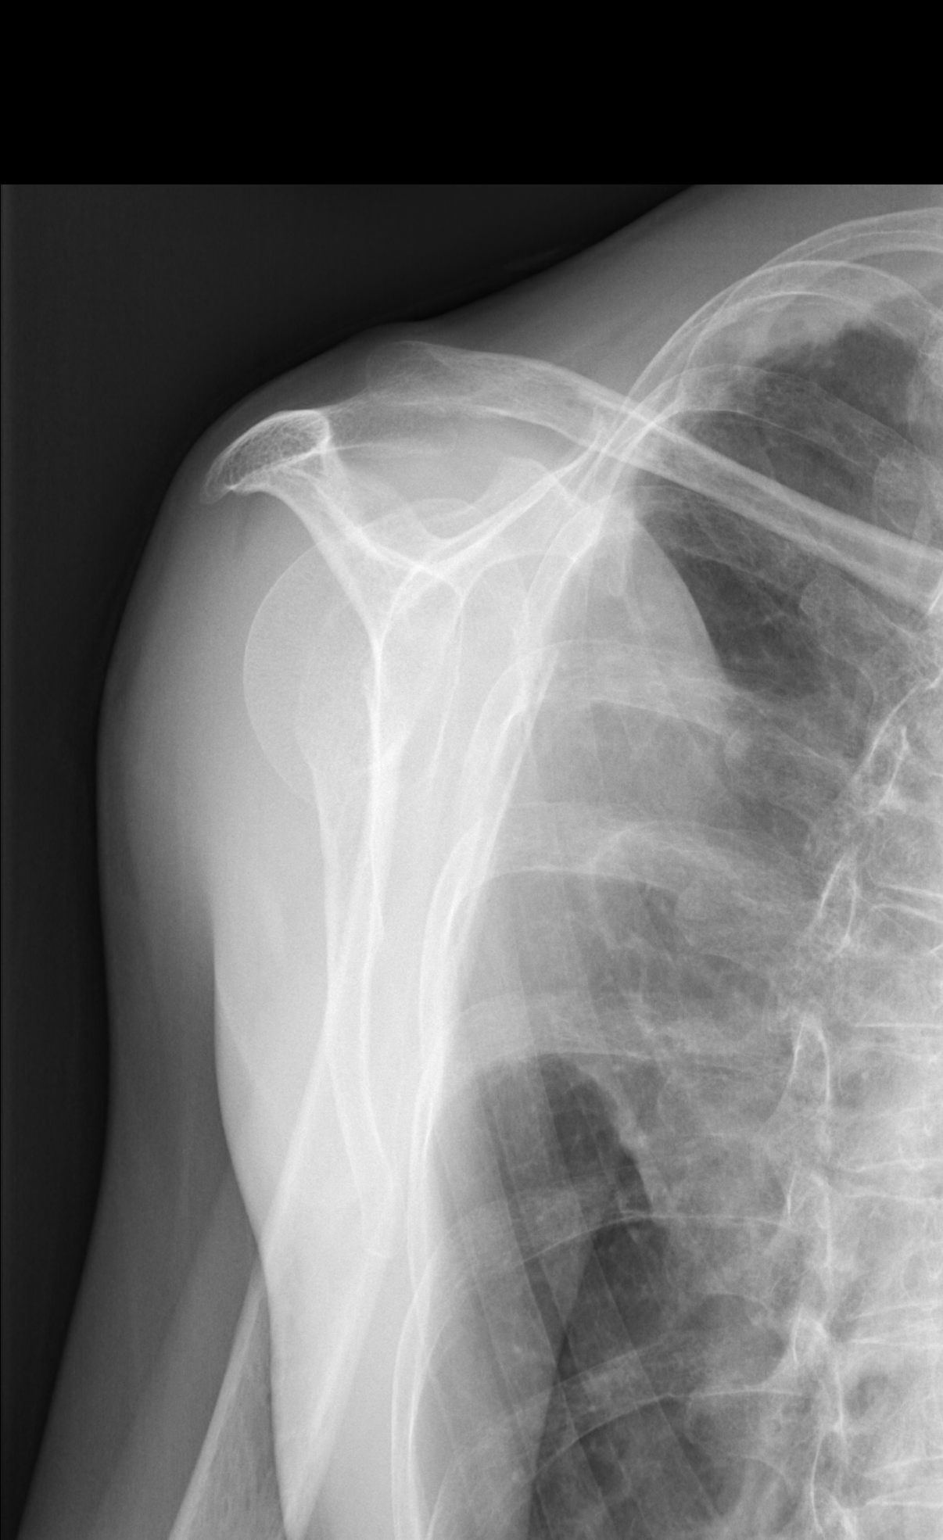

[2 of 2 positions shown; findings below may reference images not displayed]

FINDINGS: Patient was unable to position for the axillary view. AP and Y-views
are submitted. These demonstrate a mildly displaced fracture of the
humeral neck which is best seen on the AP view. There is no
dislocation or narrowing of the subacromial space. Probable
subpleural scarring is noted at the right lung apex.
IMPRESSION: Mildly displaced fracture of the right humeral neck, presumably
acute.

## 2016-01-30 IMAGING — CR DG SHOULDER 2+V*R*
2 series · 2 of 2 positions shown · non-contrast
Comparison: Right shoulder films of 10/08/2014

CLINICAL DATA: Blain Jumper, no is shoulder pain

EXAM:
RIGHT SHOULDER - 2+ VIEW

[w shoulder ap external righ]
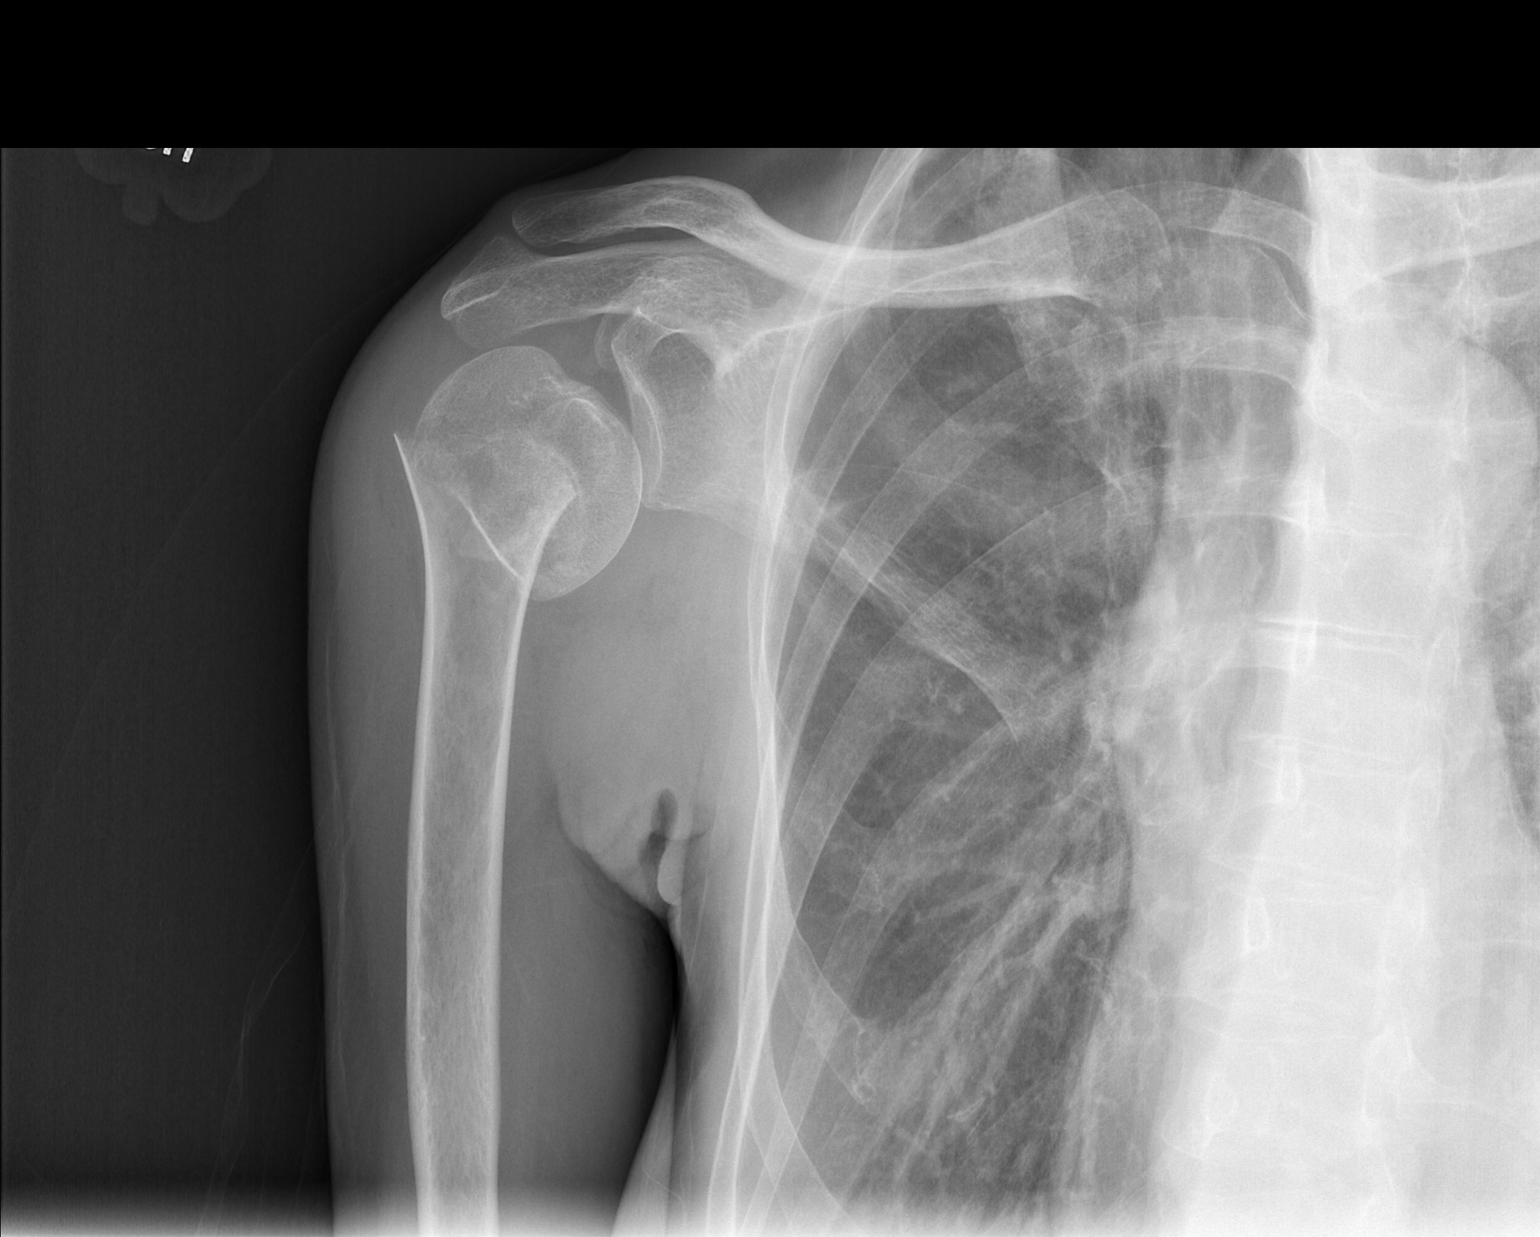

[w shoulder y view right]
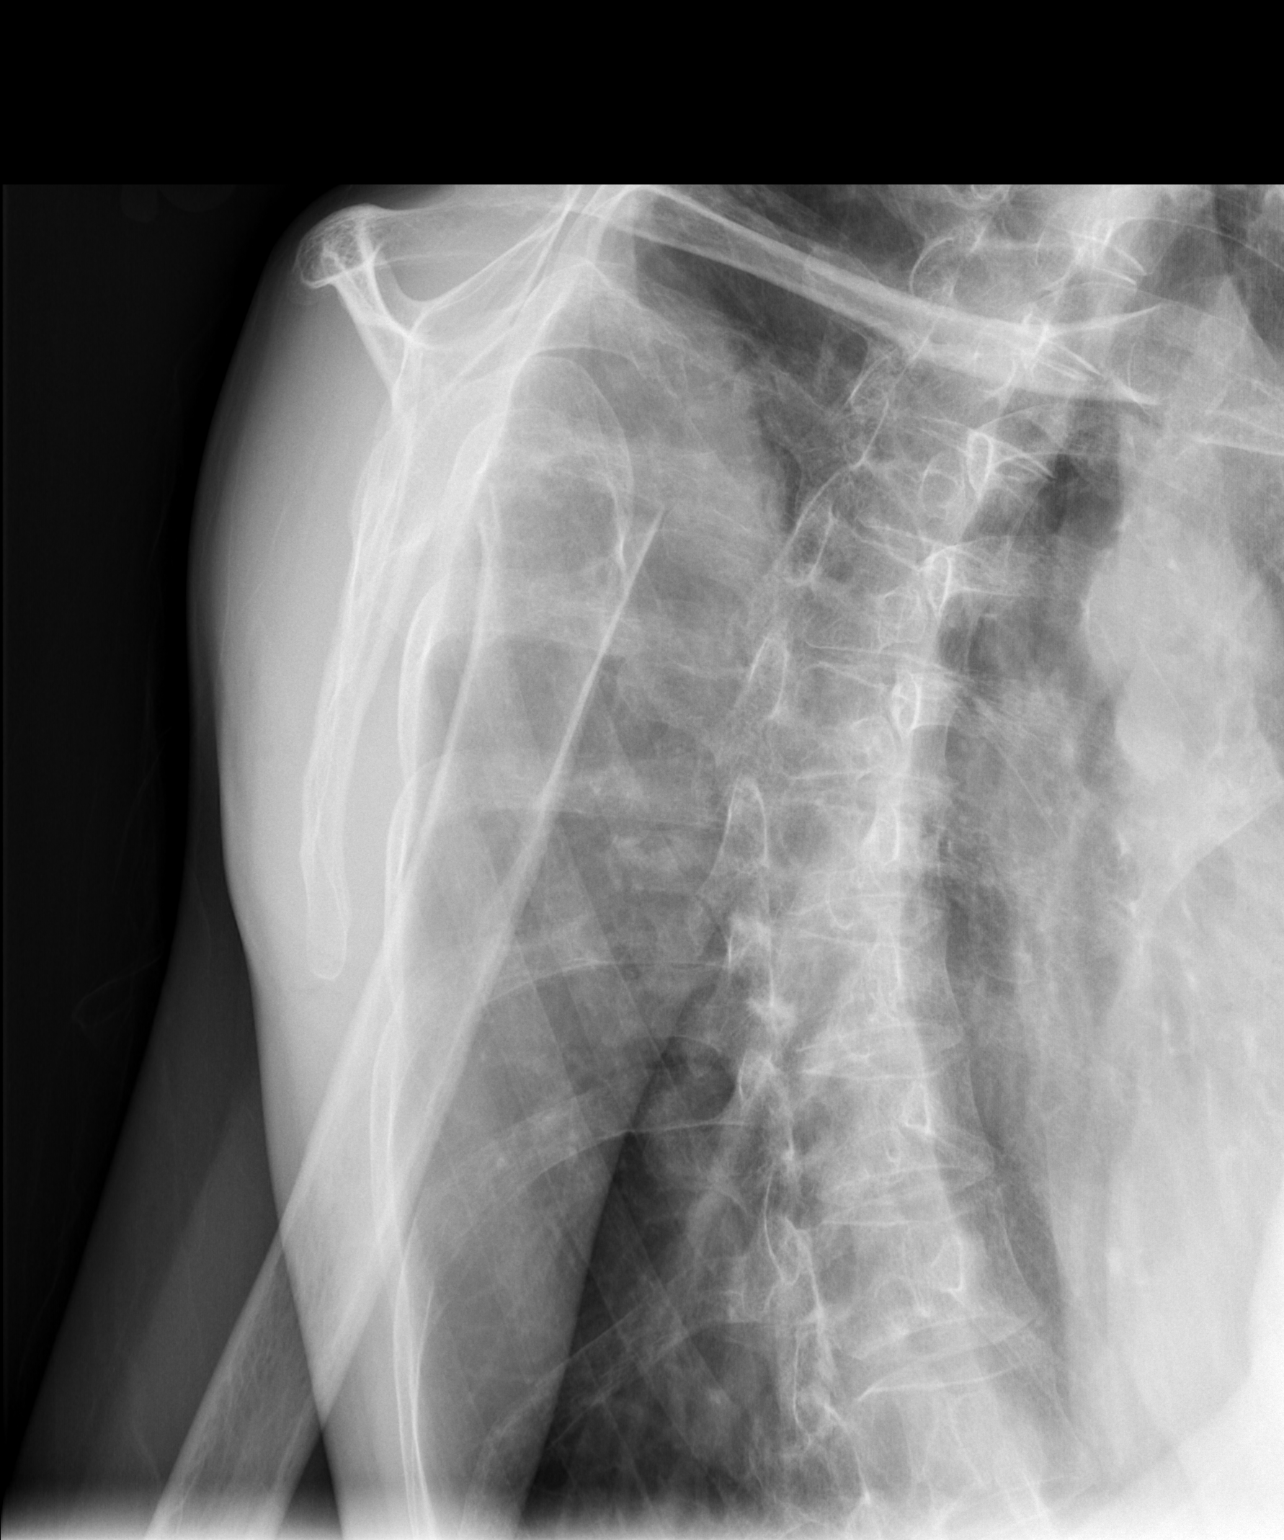

[2 of 2 positions shown; findings below may reference images not displayed]

FINDINGS: The previously noted fracture of the right humeral neck is now more
displaced with the humeral head angulated medially and inferiorly.
No other acute abnormality is seen. The AC joint is normally
aligned.
IMPRESSION: Further displacement of the previously noted fracture of the right
humeral neck.
